# Patient Record
Sex: Male | Born: 1985 | Race: White | Hispanic: No | Marital: Single | Smoking: Former smoker
Health system: Southern US, Community
[De-identification: ages and names within clinical notes are randomized; demographics above are authoritative.]

---

## 2013-07-06 ENCOUNTER — Encounter (HOSPITAL_COMMUNITY): Payer: Self-pay | Admitting: *Deleted

## 2013-07-06 ENCOUNTER — Emergency Department (HOSPITAL_COMMUNITY)
Admission: EM | Admit: 2013-07-06 | Discharge: 2013-07-06 | Disposition: A | Discharge status: Home / Self Care | Payer: Medicaid Other | Attending: Emergency Medicine | Admitting: Emergency Medicine

## 2013-07-06 ENCOUNTER — Emergency Department (HOSPITAL_COMMUNITY): Payer: Medicaid Other

## 2013-07-06 DIAGNOSIS — S82839A Other fracture of upper and lower end of unspecified fibula, initial encounter for closed fracture: Secondary | ICD-10-CM

## 2013-07-06 DIAGNOSIS — Y9229 Other specified public building as the place of occurrence of the external cause: Secondary | ICD-10-CM | POA: Insufficient documentation

## 2013-07-06 DIAGNOSIS — S82899A Other fracture of unspecified lower leg, initial encounter for closed fracture: Secondary | ICD-10-CM | POA: Insufficient documentation

## 2013-07-06 DIAGNOSIS — Y9301 Activity, walking, marching and hiking: Secondary | ICD-10-CM | POA: Insufficient documentation

## 2013-07-06 DIAGNOSIS — R296 Repeated falls: Secondary | ICD-10-CM | POA: Insufficient documentation

## 2013-07-06 MED ORDER — OXYCODONE-ACETAMINOPHEN 5-325 MG PO TABS: 1.0000 | ORAL_TABLET | ORAL | Status: DC | PRN

## 2013-07-06 MED ORDER — IBUPROFEN 800 MG PO TABS
800.0000 mg | ORAL_TABLET | Freq: Once | ORAL | Status: AC
  Administered 2013-07-06: 800 mg via ORAL
  Filled 2013-07-06: qty 1

## 2013-07-06 MED ORDER — OXYCODONE-ACETAMINOPHEN 5-325 MG PO TABS
1.0000 | ORAL_TABLET | Freq: Once | ORAL | Status: AC
  Administered 2013-07-06: 1 via ORAL
  Filled 2013-07-06: qty 1

## 2013-07-06 MED ORDER — NAPROXEN 500 MG PO TABS: 500.0000 mg | ORAL_TABLET | Freq: Two times a day (BID) | ORAL | Status: DC
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
## 2013-07-06 NOTE — ED Provider Notes (Signed)
Medical screening examination/treatment/procedure(s) were performed by non-physician practitioner and as supervising physician I was immediately available for consultation/collaboration.    Rajni Holsworth D Danalee Flath, MD 07/06/13 2334 

## 2013-07-06 NOTE — ED Notes (Signed)
Pt broke his right leg last Saturday and has not been able to see an orthopedist. Pt states he is out of pain meds and he fell again and states his leg feels like it did when he broke it last weekend.

## 2013-07-13 ENCOUNTER — Emergency Department (HOSPITAL_COMMUNITY)
Admission: EM | Admit: 2013-07-13 | Discharge: 2013-07-13 | Disposition: A | Discharge status: Home / Self Care | Payer: Medicaid Other | Attending: Emergency Medicine | Admitting: Emergency Medicine

## 2013-07-13 ENCOUNTER — Emergency Department (HOSPITAL_COMMUNITY): Payer: Medicaid Other

## 2013-07-13 ENCOUNTER — Encounter (HOSPITAL_COMMUNITY): Payer: Self-pay | Admitting: *Deleted

## 2013-07-13 DIAGNOSIS — Y9389 Activity, other specified: Secondary | ICD-10-CM | POA: Insufficient documentation

## 2013-07-13 DIAGNOSIS — S82899A Other fracture of unspecified lower leg, initial encounter for closed fracture: Secondary | ICD-10-CM | POA: Insufficient documentation

## 2013-07-13 DIAGNOSIS — F17200 Nicotine dependence, unspecified, uncomplicated: Secondary | ICD-10-CM | POA: Insufficient documentation

## 2013-07-13 DIAGNOSIS — S82891A Other fracture of right lower leg, initial encounter for closed fracture: Secondary | ICD-10-CM

## 2013-07-13 DIAGNOSIS — W010XXA Fall on same level from slipping, tripping and stumbling without subsequent striking against object, initial encounter: Secondary | ICD-10-CM | POA: Insufficient documentation

## 2013-07-13 DIAGNOSIS — Y9289 Other specified places as the place of occurrence of the external cause: Secondary | ICD-10-CM | POA: Insufficient documentation

## 2013-07-13 MED ORDER — OXYCODONE-ACETAMINOPHEN 5-325 MG PO TABS
2.0000 | ORAL_TABLET | Freq: Once | ORAL | Status: AC
  Administered 2013-07-13: 2 via ORAL
  Filled 2013-07-13: qty 2

## 2013-07-13 MED ORDER — IBUPROFEN 800 MG PO TABS
800.0000 mg | ORAL_TABLET | Freq: Once | ORAL | Status: AC
  Administered 2013-07-13: 800 mg via ORAL
  Filled 2013-07-13: qty 1

## 2013-07-13 MED ORDER — OXYCODONE-ACETAMINOPHEN 5-325 MG PO TABS: 2.0000 | ORAL_TABLET | ORAL | Status: DC | PRN

## 2013-07-13 NOTE — ED Provider Notes (Signed)
CSN: [id number]     Arrival date & time 07/13/13  0124 History   First MD Initiated Contact with Patient 07/13/13 0135     No chief complaint on file.  (Consider location/radiation/quality/duration/timing/severity/associated sxs/prior Treatment) HPI History provided by patient. Right ankle injury tonight at home. Patient suffered ankle fracture about a week ago on the right side. He has since seen an orthopedic surgeon at [facility] and currently is in a cast.  Tonight, he jumped up to stop television set from falling and believes he injured something else in his ankle. He complains of swelling to his foot and pain at the lateral aspect. No other pain injury or trauma. He took pain medications prior to arrival and has minimal discomfort at this time. Otherwise has been using crutches as directed  History reviewed. No pertinent past medical history. History reviewed. No pertinent past surgical history. History reviewed. No pertinent family history. History  Substance Use Topics  . Smoking status: Current Some Day Smoker  . Smokeless tobacco: Not on file  . Alcohol Use: Yes     Comment: occasional    Review of Systems  Constitutional: Negative for fever and chills.  HENT: Negative for neck pain.   Respiratory: Negative for shortness of breath.   Cardiovascular: Negative for chest pain.  Gastrointestinal: Negative for abdominal pain.  Genitourinary: Negative for flank pain.  Musculoskeletal: Negative for back pain.  Skin: Negative for wound.  Neurological: Negative for headaches.  All other systems reviewed and are negative.    Allergies  Review of patient's allergies indicates no known allergies.  Home Medications   Current Outpatient Rx  Name  Route  Sig  Dispense  Refill  . naproxen (NAPROSYN) 500 MG tablet   Oral   Take 1 tablet (500 mg total) by mouth 2 (two) times daily.   30 tablet   0   . oxyCODONE-acetaminophen (PERCOCET/ROXICET) 5-325 MG per tablet   Oral   Take 1  tablet by mouth every 4 (four) hours as needed for pain.   20 tablet   0   . oxyCODONE-acetaminophen (PERCOCET/ROXICET) 5-325 MG per tablet   Oral   Take 1 tablet by mouth every 4 (four) hours as needed for pain.   6 tablet   0    BP 122/71  Pulse 66  Temp(Src) 97.5 F (36.4 C) (Oral)  Resp 20  Ht 5\' 11"  (1.803 m)  Wt 186 lb (84.369 kg)  BMI 25.95 kg/m2  SpO2 99% Physical Exam  Constitutional: He is oriented to person, place, and time. He appears well-developed and well-nourished.  HENT:  Head: Normocephalic and atraumatic.  Eyes: EOM are normal. Pupils are equal, round, and reactive to light.  Neck: Neck supple.  Cardiovascular: Normal rate, regular rhythm and intact distal pulses.   Pulmonary/Chest: Effort normal and breath sounds normal. No respiratory distress.  Musculoskeletal:  Right lower extremity with cast in place from foot to proximal tib-fib. Distal motor and sensorium intact with cap refill about 3 seconds and mild edema versus left lower extremity toes. No cyanosis.  Neurological: He is alert and oriented to person, place, and time.  Skin: Skin is warm and dry.    ED Course  Procedures (including critical care time) Labs Reviewed - No data to display  Cast removed. Patient has significant ankle swelling and tenderness over her medial and lateral malleolus with old ecchymosis. No obvious deformity otherwise.  Imaging Review Dg Ankle Complete Right  07/13/2013   *RADIOLOGY REPORT*  Clinical  Data: New lateral pain and ankle injury.  Cast removed tonight.  History of recent fracture.  RIGHT ANKLE - COMPLETE 3+ VIEW  Comparison: 07/06/2013  Findings: Oblique fracture of the distal right fibula is again demonstrated without significant change in alignment or position since the previous casting views.  Mild soft tissue swelling. There is a fragment demonstrated off of the posterior malleolus of the distal tibia which was not identified previously.  This could represent  an new acute fracture or it may just be better visualized today.  Prominent posterior process of the talus.  IMPRESSION: Stable alignment of the previous distal right fibular fracture. Osseous fragment posterior to the posterior malleolus was not seen previously and may represent a new fracture.   Original Report Authenticated By: Burman Nieves, M.D.   X-ray results shared with patient and placed in a posterior / sugar tong splint. He has crutches with him. He agrees to call his orthopedic surgeon on Monday to schedule followup in the office. Prescription provided. All questions answered. Discharge and followup instructions verbalized as understood.   MDM  Right ankle fracture New acute fracture with healing subacute fracture  Splint - distal neurovascular intact post procedure Previous records, vital signs and nurses notes reviewed     Sunnie Nielsen, MD 07/13/13 0403

## 2013-07-13 NOTE — ED Notes (Signed)
Pt reports that his leg was placed in a cast last week, tonight leg was injured and toes immediately began to swell. Reporting some numbness and tingling in toes.

## 2013-07-15 MED FILL — Oxycodone w/ Acetaminophen Tab 5-325 MG: ORAL | Qty: 6 | Status: AC

## 2014-02-28 ENCOUNTER — Encounter (HOSPITAL_COMMUNITY): Payer: Self-pay | Admitting: Emergency Medicine

## 2014-02-28 ENCOUNTER — Emergency Department (HOSPITAL_COMMUNITY)
Admission: EM | Admit: 2014-02-28 | Discharge: 2014-02-28 | Disposition: A | Discharge status: Home / Self Care | Payer: Medicaid Other | Attending: Emergency Medicine | Admitting: Emergency Medicine

## 2014-02-28 DIAGNOSIS — W230XXA Caught, crushed, jammed, or pinched between moving objects, initial encounter: Secondary | ICD-10-CM | POA: Insufficient documentation

## 2014-02-28 DIAGNOSIS — S3121XA Laceration without foreign body of penis, initial encounter: Secondary | ICD-10-CM

## 2014-02-28 DIAGNOSIS — Z23 Encounter for immunization: Secondary | ICD-10-CM | POA: Insufficient documentation

## 2014-02-28 DIAGNOSIS — Y929 Unspecified place or not applicable: Secondary | ICD-10-CM | POA: Insufficient documentation

## 2014-02-28 DIAGNOSIS — Z791 Long term (current) use of non-steroidal anti-inflammatories (NSAID): Secondary | ICD-10-CM | POA: Insufficient documentation

## 2014-02-28 DIAGNOSIS — S3120XA Unspecified open wound of penis, initial encounter: Secondary | ICD-10-CM | POA: Insufficient documentation

## 2014-02-28 DIAGNOSIS — Y9389 Activity, other specified: Secondary | ICD-10-CM | POA: Insufficient documentation

## 2014-02-28 DIAGNOSIS — F17200 Nicotine dependence, unspecified, uncomplicated: Secondary | ICD-10-CM | POA: Insufficient documentation

## 2014-02-28 MED ORDER — TETANUS-DIPHTH-ACELL PERTUSSIS 5-2.5-18.5 LF-MCG/0.5 IM SUSP
0.5000 mL | Freq: Once | INTRAMUSCULAR | Status: AC
  Administered 2014-02-28: 0.5 mL via INTRAMUSCULAR
  Filled 2014-02-28: qty 0.5

## 2014-02-28 NOTE — Discharge Instructions (Signed)
Tissue Adhesive Wound Care Some cuts, wounds, lacerations, and incisions can be repaired by using tissue adhesive. Tissue adhesive is like glue. It holds the skin together, allowing for faster healing. It forms a strong bond on the skin in about 1 minute and reaches its full strength in about 2 or 3 minutes. The adhesive disappears naturally while the wound is healing. It is important to take proper care of your wound at home while it heals.  HOME CARE INSTRUCTIONS   Showers are allowed. Do not soak the area containing the tissue adhesive. Do not take baths, swim, or use hot tubs. Do not use any soaps or ointments on the wound. Certain ointments can weaken the glue.  If a bandage (dressing) has been applied, follow your health care provider's instructions for how often to change the dressing.   Keep the dressing dry if one has been applied.   Do not scratch, pick, or rub the adhesive.   Do not place tape over the adhesive. The adhesive could come off when pulling the tape off.   Protect the wound from further injury until it is healed.   Protect the wound from sun and tanning bed exposure while it is healing and for several weeks after healing.   Only take over-the-counter or prescription medicines as directed by your health care provider.   Keep all follow-up appointments as directed by your health care provider. SEEK IMMEDIATE MEDICAL CARE IF:   Your wound becomes red, swollen, hot, or tender.   You develop a rash after the glue is applied.  You have increasing pain in the wound.   You have a red streak that goes away from the wound.   You have pus coming from the wound.   You have increased bleeding.  You have a fever.  You have shaking chills.   You notice a bad smell coming from the wound.   Your wound or adhesive breaks open.  MAKE SURE YOU:   Understand these instructions.  Will watch your condition.  Will get help right away if you are not doing  well or get worse. Document Released: 04/19/2001 Document Revised: 08/14/2013 Document Reviewed: 05/15/2013 [facility]ExitCare Patient Information 2014 [place], [place].  Tetanus, Diphtheria, Pertussis (Tdap) Vaccine What You Need to Know WHY GET VACCINATED? Tetanus, diphtheria and pertussis can be very serious diseases, even for adolescents and adults. Tdap vaccine can protect us from these diseases. TETANUS (Lockjaw) causes painful muscle tightening and stiffness, usually all over the body.  It can lead to tightening of muscles in the head and neck so you can't open your mouth, swallow, or sometimes even breathe. Tetanus kills about 1 out of 5 people who are infected. DIPHTHERIA can cause a thick coating to form in the back of the throat.  It can lead to breathing problems, paralysis, heart failure, and death. PERTUSSIS (Whooping Cough) causes severe coughing spells, which can cause difficulty breathing, vomiting and disturbed sleep.  It can also lead to weight loss, incontinence, and rib fractures. Up to 2 in 100 adolescents and 5 in 100 adults with pertussis are hospitalized or have complications, which could include pneumonia and death. These diseases are caused by bacteria. Diphtheria and pertussis are spread from person to person through coughing or sneezing. Tetanus enters the body through cuts, scratches, or wounds. Before vaccines, the [place] saw as many as 200,000 cases a year of diphtheria and pertussis, and hundreds of cases of tetanus. Since vaccination began, tetanus and diphtheria have dropped  by about 99% and pertussis by about 80%. TDAP VACCINE Tdap vaccine can protect adolescents and adults from tetanus, diphtheria, and pertussis. One dose of Tdap is routinely given at age 67 or 75. People who did not get Tdap at that age should get it as soon as possible. Tdap is especially important for health care professionals and anyone having close contact with a baby younger than 12  months. Pregnant women should get a dose of Tdap during every pregnancy, to protect the newborn from pertussis. Infants are most at risk for severe, life-threatening complications from pertussis. A similar vaccine, called Td, protects from tetanus and diphtheria, but not pertussis. A Td booster should be given every 10 years. Tdap may be given as one of these boosters if you have not already gotten a dose. Tdap may also be given after a severe cut or burn to prevent tetanus infection. Your doctor can give you more information. Tdap may safely be given at the same time as other vaccines. SOME PEOPLE SHOULD NOT GET THIS VACCINE  If you ever had a life-threatening allergic reaction after a dose of any tetanus, diphtheria, or pertussis containing vaccine, OR if you have a severe allergy to any part of this vaccine, you should not get Tdap. Tell your doctor if you have any severe allergies.  If you had a coma, or long or multiple seizures within 7 days after a childhood dose of DTP or DTaP, you should not get Tdap, unless a cause other than the vaccine was found. You can still get Td.  Talk to your doctor if you:  have epilepsy or another nervous system problem,  had severe pain or swelling after any vaccine containing diphtheria, tetanus or pertussis,  ever had Guillain-Barr Syndrome (GBS),  aren't feeling well on the day the shot is scheduled. RISKS OF A VACCINE REACTION With any medicine, including vaccines, there is a chance of side effects. These are usually mild and go away on their own, but serious reactions are also possible. Brief fainting spells can follow a vaccination, leading to injuries from falling. Sitting or lying down for about 15 minutes can help prevent these. Tell your doctor if you feel dizzy or light-headed, or have vision changes or ringing in the ears. Mild problems following Tdap (Did not interfere with activities)  Pain where the shot was given (about 3 in 4  adolescents or 2 in 3 adults)  Redness or swelling where the shot was given (about 1 person in 5)  Mild fever of at least 100.81F (up to about 1 in 25 adolescents or 1 in 100 adults)  Headache (about 3 or 4 people in 10)  Tiredness (about 1 person in 3 or 4)  Nausea, vomiting, diarrhea, stomach ache (up to 1 in 4 adolescents or 1 in 10 adults)  Chills, body aches, sore joints, rash, swollen glands (uncommon) Moderate problems following Tdap (Interfered with activities, but did not require medical attention)  Pain where the shot was given (about 1 in 5 adolescents or 1 in 100 adults)  Redness or swelling where the shot was given (up to about 1 in 16 adolescents or 1 in 25 adults)  Fever over 102F (about 1 in 100 adolescents or 1 in 250 adults)  Headache (about 3 in 20 adolescents or 1 in 10 adults)  Nausea, vomiting, diarrhea, stomach ache (up to 1 or 3 people in 100)  Swelling of the entire arm where the shot was given (up to about 3 in  100). Severe problems following Tdap (Unable to perform usual activities, required medical attention)  Swelling, severe pain, bleeding and redness in the arm where the shot was given (rare). A severe allergic reaction could occur after any vaccine (estimated less than 1 in a million doses). WHAT IF THERE IS A SERIOUS REACTION? What should I look for?  Look for anything that concerns you, such as signs of a severe allergic reaction, very high fever, or behavior changes. Signs of a severe allergic reaction can include hives, swelling of the face and throat, difficulty breathing, a fast heartbeat, dizziness, and weakness. These would start a few minutes to a few hours after the vaccination. What should I do?  If you think it is a severe allergic reaction or other emergency that can't wait, call 9-1-1 or get the person to the nearest hospital. Otherwise, call your doctor.  Afterward, the reaction should be reported to the "Vaccine Adverse Event  Reporting System" (VAERS). Your doctor might file this report, or you can do it yourself through the VAERS web site at www.vaers.LAgents.nohhs.gov, or by calling 1-206-255-0135. VAERS is only for reporting reactions. They do not give medical advice.  THE NATIONAL VACCINE INJURY COMPENSATION PROGRAM The National Vaccine Injury Compensation Program (VICP) is a federal program that was created to compensate people who may have been injured by certain vaccines. Persons who believe they may have been injured by a vaccine can learn about the program and about filing a claim by calling 1-548-579-2796 or visiting the VICP website at SpiritualWord.atwww.hrsa.gov/vaccinecompensation. HOW CAN I LEARN MORE?  Ask your doctor.  Call your local or state health department.  Contact the Centers for Disease Control and Prevention (CDC):  Call 450-164-68151-(973)294-0682 or visit CDC's website at PicCapture.uywww.cdc.gov/vaccines. CDC Tdap Vaccine VIS (03/15/12) Document Released: 04/24/2012 Document Revised: 02/18/2013 Document Reviewed: 02/13/2013 [facility]ExitCare Patient Information 2014 [place]ExitCare, [place]LLC.

## 2014-02-28 NOTE — ED Notes (Signed)
Discharge instructions given and reviewed with patient.  Patient verbalized understanding to follow up with Dr. Jerre SimonJavaid.  Patient ambulatory; discharged home in good condition.

## 2014-02-28 NOTE — ED Provider Notes (Signed)
CSN: [id number]     Arrival date & time 02/28/14  0134 History   First MD Initiated Contact with Patient 02/28/14 0137     Chief complaint: Penis injury  (Consider location/radiation/quality/duration/timing/severity/associated sxs/prior Treatment) The history is provided by the patient.   28 year old male had his penis get caught in his upper and states that there is a small laceration present. He does not know his last tetanus immunization was. He rates pain at 10/10. He denies other injury.  No past medical history on file. No past surgical history on file. No family history on file. History  Substance Use Topics  . Smoking status: Current Some Day Smoker  . Smokeless tobacco: Not on file  . Alcohol Use: Yes     Comment: occasional    Review of Systems  All other systems reviewed and are negative.     Allergies  Review of patient's allergies indicates no known allergies.  Home Medications   Prior to Admission medications   Medication Sig Start Date End Date Taking? Authorizing Provider  naproxen (NAPROSYN) 500 MG tablet Take 1 tablet (500 mg total) by mouth 2 (two) times daily. 07/06/13   Hope Orlene OchM Neese, NP  oxyCODONE-acetaminophen (PERCOCET/ROXICET) 5-325 MG per tablet Take 1 tablet by mouth every 4 (four) hours as needed for pain. 07/06/13   Hope Orlene OchM Neese, NP  oxyCODONE-acetaminophen (PERCOCET/ROXICET) 5-325 MG per tablet Take 1 tablet by mouth every 4 (four) hours as needed for pain. 07/06/13   Hope Orlene OchM Neese, NP  oxyCODONE-acetaminophen (PERCOCET/ROXICET) 5-325 MG per tablet Take 2 tablets by mouth every 4 (four) hours as needed for pain. 07/13/13   Sunnie NielsenBrian Opitz, MD   BP 131/78  Pulse 81  Temp(Src) 97.8 F (36.6 C) (Oral)  Resp 14  Ht 5\' 7"  (1.702 m)  Wt 185 lb (83.915 kg)  BMI 28.97 kg/m2  SpO2 98% Physical Exam  Nursing note and vitals reviewed.  28 year old male, resting comfortably and in no acute distress. Vital signs are normal. Oxygen saturation is 98%, which is  normal. Head is normocephalic and atraumatic. PERRLA, EOMI. Oropharynx is clear. Neck is nontender and supple without adenopathy or JVD. Back is nontender and there is no CVA tenderness. Lungs are clear without rales, wheezes, or rhonchi. Chest is nontender. Heart has regular rate and rhythm without murmur. Abdomen is soft, flat, nontender without masses or hepatosplenomegaly and peristalsis is normoactive. Genitalia: Uncircumcised penis. There is a superficial laceration on the ventral side of the glans penis and also on the prepuce. No deep laceration. Extremities have no cyanosis or edema, full range of motion is present. Skin is warm and dry without rash. Neurologic: Mental status is normal, cranial nerves are intact, there are no motor or sensory deficits.  ED Course  Procedures (including critical care time) LACERATION REPAIR Performed by: Dione Boozeavid Johnica Armwood Authorized by: Dione Boozeavid Marielys Trinidad Consent: Verbal consent obtained. Risks and benefits: risks, benefits and alternatives were discussed Consent given by: patient Patient identity confirmed: provided demographic data Prepped and Draped in normal sterile fashion Wound explored  Laceration Location: penis  Laceration Length: 0.3 cm  No Foreign Bodies seen or palpated  Anesthesia: none  Amount of cleaning: standard  Skin closure: close  Technique: Tissue Adhesive  Patient tolerance: Patient tolerated the procedure well with no immediate complications.  MDM   Final diagnoses:  Laceration of penis    Laceration of the penis. This will be treated with Dermabond. TDaP booster given.    Dione Boozeavid Garmon Dehn, MD 02/28/14  0213 

## 2014-02-28 NOTE — ED Notes (Signed)
Pt states he cut a piece of skin from the foreskin of his penis when he got it stuck in his zipper.

## 2015-09-29 ENCOUNTER — Emergency Department (HOSPITAL_COMMUNITY)
Admission: EM | Admit: 2015-09-29 | Discharge: 2015-09-29 | Disposition: A | Discharge status: Home / Self Care | Payer: Medicaid Other | Attending: Emergency Medicine | Admitting: Emergency Medicine

## 2015-09-29 ENCOUNTER — Encounter (HOSPITAL_COMMUNITY): Payer: Self-pay | Admitting: Emergency Medicine

## 2015-09-29 DIAGNOSIS — Z791 Long term (current) use of non-steroidal anti-inflammatories (NSAID): Secondary | ICD-10-CM | POA: Insufficient documentation

## 2015-09-29 DIAGNOSIS — L0211 Cutaneous abscess of neck: Secondary | ICD-10-CM | POA: Insufficient documentation

## 2015-09-29 DIAGNOSIS — F17200 Nicotine dependence, unspecified, uncomplicated: Secondary | ICD-10-CM | POA: Insufficient documentation

## 2015-09-29 DIAGNOSIS — R51 Headache: Secondary | ICD-10-CM | POA: Insufficient documentation

## 2015-09-29 DIAGNOSIS — L02811 Cutaneous abscess of head [any part, except face]: Secondary | ICD-10-CM

## 2015-09-29 MED ORDER — PROMETHAZINE HCL 12.5 MG PO TABS
12.5000 mg | ORAL_TABLET | Freq: Once | ORAL | Status: AC
  Administered 2015-09-29: 12.5 mg via ORAL
  Filled 2015-09-29: qty 1

## 2015-09-29 MED ORDER — DOXYCYCLINE HYCLATE 100 MG PO CAPS: 100.0000 mg | ORAL_CAPSULE | Freq: Two times a day (BID) | ORAL | Status: AC

## 2015-09-29 MED ORDER — ACETAMINOPHEN-CODEINE #3 300-30 MG PO TABS: 1.0000 | ORAL_TABLET | Freq: Four times a day (QID) | ORAL | Status: AC | PRN

## 2015-09-29 MED ORDER — CEPHALEXIN 500 MG PO CAPS
500.0000 mg | ORAL_CAPSULE | Freq: Once | ORAL | Status: AC
  Administered 2015-09-29: 500 mg via ORAL
  Filled 2015-09-29: qty 1

## 2015-09-29 MED ORDER — ACETAMINOPHEN-CODEINE #3 300-30 MG PO TABS
2.0000 | ORAL_TABLET | Freq: Once | ORAL | Status: AC
  Administered 2015-09-29: 2 via ORAL
  Filled 2015-09-29: qty 2

## 2015-09-29 MED ORDER — DOXYCYCLINE HYCLATE 100 MG PO TABS
100.0000 mg | ORAL_TABLET | Freq: Once | ORAL | Status: AC
  Administered 2015-09-29: 100 mg via ORAL
  Filled 2015-09-29: qty 1

## 2015-09-29 MED ORDER — MUPIROCIN CALCIUM 2 % NA OINT: TOPICAL_OINTMENT | NASAL | Status: AC

## 2015-09-29 NOTE — ED Notes (Signed)
Instructed pt to take all of antibiotics as prescribed.Pt verbalized understanding of no driving and to use caution within 4 hours of taking pain meds due to meds cause drowsiness 

## 2015-09-29 NOTE — ED Notes (Signed)
Pt reports boil to back of head for last several days. Pt reports intermittent chills.

## 2015-09-29 NOTE — Discharge Instructions (Signed)
Please soak the abscess area and warm Epsom salt water for 15-20 minutes daily until resolved. Please apply Bactroban ointment 2 times daily. Please use doxycycline 2 times daily with food. Use Tylenol or ibuprofen for mild pain, use Tylenol codeine for more severe pain. This medication may cause drowsiness, please use with caution. Abscess An abscess (boil or furuncle) is an infected area on or under the skin. This area is filled with yellowish-white fluid (pus) and other material (debris). HOME CARE   Only take medicines as told by your doctor.  If you were given antibiotic medicine, take it as directed. Finish the medicine even if you start to feel better.  If gauze is used, follow your doctor's directions for changing the gauze.  To avoid spreading the infection:  Keep your abscess covered with a bandage.  Wash your hands well.  Do not share personal care items, towels, or whirlpools with others.  Avoid skin contact with others.  Keep your skin and clothes clean around the abscess.  Keep all doctor visits as told. GET HELP RIGHT AWAY IF:   You have more pain, puffiness (swelling), or redness in the wound site.  You have more fluid or blood coming from the wound site.  You have muscle aches, chills, or you feel sick.  You have a fever. MAKE SURE YOU:   Understand these instructions.  Will watch your condition.  Will get help right away if you are not doing well or get worse.   This information is not intended to replace advice given to you by your health care provider. Make sure you discuss any questions you have with your health care provider.   Document Released: 04/11/2008 Document Revised: 04/24/2012 Document Reviewed: 01/07/2012 Elsevier Interactive Patient Education [employer]2016 Elsevier Inc.

## 2015-09-29 NOTE — ED Provider Notes (Signed)
CSN: [id number]     Arrival date & time 09/29/15  1624 History   First MD Initiated Contact with Patient 09/29/15 1631     Chief Complaint  Patient presents with  . Abscess     (Consider location/radiation/quality/duration/timing/severity/associated sxs/prior Treatment) Patient is a 29 y.o. male presenting with abscess. The history is provided by the patient.  Abscess Location:  Head/neck Head/neck abscess location:  Scalp Abscess quality: painful and redness   Red streaking: no   Duration:  1 week Progression:  Worsening Pain details:    Quality:  Aching and shooting   Severity:  Moderate   Duration:  1 week   Timing:  Intermittent   Progression:  Worsening Chronicity:  New Context: not diabetes and not immunosuppression   Relieved by:  Nothing Worsened by:  Draining/squeezing Associated symptoms: headaches   Associated symptoms: no fever and no nausea   Risk factors: no hx of MRSA     No past medical history on file. No past surgical history on file. No family history on file. Social History  Substance Use Topics  . Smoking status: Current Some Day Smoker -- 1.00 packs/day  . Smokeless tobacco: Not on file  . Alcohol Use: Yes     Comment: occasional    Review of Systems  Constitutional: Negative for fever.  Gastrointestinal: Negative for nausea.  Neurological: Positive for headaches.  All other systems reviewed and are negative.     Allergies  Review of patient's allergies indicates no known allergies.  Home Medications   Prior to Admission medications   Medication Sig Start Date End Date Taking? Authorizing Provider  naproxen (NAPROSYN) 500 MG tablet Take 1 tablet (500 mg total) by mouth 2 (two) times daily. 07/06/13   Hope Orlene OchM Neese, NP  oxyCODONE-acetaminophen (PERCOCET/ROXICET) 5-325 MG per tablet Take 1 tablet by mouth every 4 (four) hours as needed for pain. 07/06/13   Hope Orlene OchM Neese, NP  oxyCODONE-acetaminophen (PERCOCET/ROXICET) 5-325 MG per tablet  Take 1 tablet by mouth every 4 (four) hours as needed for pain. 07/06/13   Hope Orlene OchM Neese, NP  oxyCODONE-acetaminophen (PERCOCET/ROXICET) 5-325 MG per tablet Take 2 tablets by mouth every 4 (four) hours as needed for pain. 07/13/13   Sunnie NielsenBrian Opitz, MD   BP 116/66 mmHg  Pulse 83  Temp(Src) 97.7 F (36.5 C) (Oral)  Resp 18  Ht 5\' 10"  (1.778 m)  Wt 85.73 kg  BMI 27.12 kg/m2  SpO2 96% Physical Exam  Constitutional: He is oriented to person, place, and time. He appears well-developed and well-nourished.  Non-toxic appearance.  HENT:  Head: Normocephalic.    Right Ear: Tympanic membrane and external ear normal.  Left Ear: Tympanic membrane and external ear normal.  Eyes: EOM and lids are normal. Pupils are equal, round, and reactive to light.  Neck: Normal range of motion. Neck supple. Carotid bruit is not present.  No palpable nodes of the post auricular area, or the posterior neck.  Cardiovascular: Normal rate, regular rhythm, normal heart sounds, intact distal pulses and normal pulses.   Pulmonary/Chest: Breath sounds normal. No respiratory distress.  Abdominal: Soft. Bowel sounds are normal. There is no tenderness. There is no guarding.  Musculoskeletal: Normal range of motion.  Lymphadenopathy:       Head (right side): No submandibular adenopathy present.       Head (left side): No submandibular adenopathy present.    He has no cervical adenopathy.  Neurological: He is alert and oriented to person, place, and time. He  has normal strength. No cranial nerve deficit or sensory deficit.  Skin: Skin is warm and dry.  Psychiatric: He has a normal mood and affect. His speech is normal.  Nursing note and vitals reviewed.   ED Course  Procedures (including critical care time) Labs Review Labs Reviewed - No data to display  Imaging Review No results found. I have personally reviewed and evaluated these images and lab results as part of my medical decision-making.   EKG  Interpretation None      MDM  Vital signs well within normal limits. Patient has 3 raised scabbed areas of the right posterior scalp. There is also an abscess area that is firm to touch. No red streaks appreciated. This area shows signs of infection, but is not a candidate for incision and drainage at this time. The patient is asked to use warm Epsom salt soaks daily. He is to use Bactroban 2 times daily. He is also given a prescription for doxycycline 2 times daily with food. The patient is to use Tylenol or ibuprofen for mild pain. Prescription for Tylenol codeine given for more severe pain. The patient is to see his surgeon, or return to the emergency department if this area is not improving.    Final diagnoses:  None    *I have reviewed nursing notes, vital signs, and all appropriate lab and imaging results for this patient.Ivery Quale, PA-C 09/29/15 1658  Samuel Jester, DO 10/01/15 2100

## 2018-05-08 ENCOUNTER — Encounter (HOSPITAL_COMMUNITY): Payer: Self-pay | Admitting: Emergency Medicine

## 2018-05-08 ENCOUNTER — Emergency Department (HOSPITAL_COMMUNITY): Payer: BLUE CROSS/BLUE SHIELD

## 2018-05-08 ENCOUNTER — Other Ambulatory Visit: Payer: Self-pay

## 2018-05-08 ENCOUNTER — Emergency Department (HOSPITAL_COMMUNITY)
Admission: EM | Admit: 2018-05-08 | Discharge: 2018-05-09 | Disposition: A | Discharge status: Home / Self Care | Payer: BLUE CROSS/BLUE SHIELD | Attending: Emergency Medicine | Admitting: Emergency Medicine

## 2018-05-08 DIAGNOSIS — Y999 Unspecified external cause status: Secondary | ICD-10-CM | POA: Diagnosis not present

## 2018-05-08 DIAGNOSIS — S0101XA Laceration without foreign body of scalp, initial encounter: Secondary | ICD-10-CM

## 2018-05-08 DIAGNOSIS — Y9389 Activity, other specified: Secondary | ICD-10-CM | POA: Insufficient documentation

## 2018-05-08 DIAGNOSIS — Z87891 Personal history of nicotine dependence: Secondary | ICD-10-CM | POA: Diagnosis not present

## 2018-05-08 DIAGNOSIS — Y929 Unspecified place or not applicable: Secondary | ICD-10-CM | POA: Diagnosis not present

## 2018-05-08 DIAGNOSIS — S51011A Laceration without foreign body of right elbow, initial encounter: Secondary | ICD-10-CM | POA: Diagnosis not present

## 2018-05-08 DIAGNOSIS — S0990XA Unspecified injury of head, initial encounter: Secondary | ICD-10-CM | POA: Diagnosis present

## 2018-05-08 DIAGNOSIS — W1789XA Other fall from one level to another, initial encounter: Secondary | ICD-10-CM | POA: Insufficient documentation

## 2018-05-08 DIAGNOSIS — S0003XA Contusion of scalp, initial encounter: Secondary | ICD-10-CM

## 2018-05-08 MED ORDER — LIDOCAINE HCL (PF) 1 % IJ SOLN
20.0000 mL | Freq: Once | INTRAMUSCULAR | Status: AC
  Administered 2018-05-08: 20 mL

## 2018-05-08 MED ORDER — LIDOCAINE HCL (PF) 1 % IJ SOLN
INTRAMUSCULAR | Status: AC
  Administered 2018-05-08: 01:00:00 20 mL
  Filled 2018-05-08: qty 8

## 2018-05-08 NOTE — ED Triage Notes (Signed)
Pt states he fell 30 ft and has lac the back of his head and right elbow. Pt denies any loc.

## 2018-05-09 MED ORDER — HYDROCODONE-ACETAMINOPHEN 5-325 MG PO TABS: 2.0000 | ORAL_TABLET | ORAL | 0 refills | Status: AC | PRN

## 2018-05-09 NOTE — ED Notes (Signed)
Pt ambulatory to waiting room. Pt verbalized understanding of discharge instructions.   

## 2018-05-09 NOTE — Discharge Instructions (Signed)
Suture and staple removal in 8 days

## 2018-05-10 NOTE — ED Provider Notes (Signed)
[facility] EMERGENCY DEPARTMENT Provider Note   CSN: [id number] Arrival date & time: 05/08/18  2238     History   Chief Complaint Chief Complaint  Patient presents with  . Fall    HPI Timothy Kirk is a 32 y.o. male.  The history is provided by the patient. No language interpreter was used.  Fall  This is a new problem. The current episode started 1 to 2 hours ago. The problem occurs constantly. Associated symptoms include headaches. Nothing aggravates the symptoms. Nothing relieves the symptoms. He has tried nothing for the symptoms. The treatment provided no relief.  Pt [place] and rope [place] causing pt to fall head first onto rocks.  Pt has a cut to his elbow and his head.  Pt was able to control bleeding with duct tape.   History reviewed. No pertinent past medical history.  There are no active problems to display for this patient.   History reviewed. No pertinent surgical history.      Home Medications    Prior to Admission medications   Medication Sig Start Date End Date Taking? Authorizing Provider  acetaminophen-codeine (TYLENOL #3) 300-30 MG tablet Take 1-2 tablets by mouth every 6 (six) hours as needed. Take with food 09/29/15   Ivery Quale, PA-C  doxycycline (VIBRAMYCIN) 100 MG capsule Take 1 capsule (100 mg total) by mouth 2 (two) times daily. 09/29/15   Ivery Quale, PA-C  HYDROcodone-acetaminophen (NORCO/VICODIN) 5-325 MG tablet Take 2 tablets by mouth every 4 (four) hours as needed. 05/09/18   Elson Areas, PA-C  mupirocin nasal ointment (BACTROBAN) 2 % Apply to affected area of the scalp bid 09/29/15   Ivery Quale, PA-C    Family History No family history on file.  Social History Social History   Tobacco Use  . Smoking status: Former Smoker    Packs/day: 1.00  . Smokeless tobacco: Never Used  Substance Use Topics  . Alcohol use: Yes    Comment: occasional  . Drug use: No     Allergies   Patient has  no known allergies.   Review of Systems Review of Systems  Neurological: Positive for headaches.  All other systems reviewed and are negative.    Physical Exam Updated Vital Signs BP 138/81 (BP Location: Left Arm)   Pulse 92   Temp 98.4 F (36.9 C) (Oral)   Resp 19   Ht 5\' 7"  (1.702 m)   Wt 99.8 kg (220 lb)   SpO2 98%   BMI 34.46 kg/m   Physical Exam  Constitutional: He appears well-developed and well-nourished.  HENT:  Head: Atraumatic.  2 cm laceration occipital occipital scalp,   Eyes: Pupils are equal, round, and reactive to light.  Cardiovascular: Normal rate.  Pulmonary/Chest: Breath sounds normal.  Abdominal: Soft.  Musculoskeletal:  2cm laceration right elbow.   Neurological: He is alert.  Skin: Skin is warm.  Psychiatric: He has a normal mood and affect.  Nursing note and vitals reviewed.    ED Treatments / Results  Labs (all labs ordered are listed, but only abnormal results are displayed) Labs Reviewed - No data to display  EKG None  Radiology Dg Elbow Complete Right  Result Date: 05/09/2018 CLINICAL DATA:  Larey Seat onto right elbow EXAM: RIGHT ELBOW - COMPLETE 3+ VIEW COMPARISON:  None. FINDINGS: No acute fracture or malalignment. No significant elbow effusion. Posterior soft tissue swelling and laceration. No radiopaque foreign body IMPRESSION: No acute osseous abnormality  Electronically Signed   By: Jasmine PangKim  Fujinaga M.D.   On: 05/09/2018 00:27   Ct Head Wo Contrast  Result Date: 05/09/2018 CLINICAL DATA:  Fell 30 feet and hit back of head EXAM: CT HEAD WITHOUT CONTRAST TECHNIQUE: Contiguous axial images were obtained from the base of the skull through the vertex without intravenous contrast. COMPARISON:  None. FINDINGS: Brain: No evidence of acute infarction, hemorrhage, hydrocephalus, extra-axial collection or mass lesion/mass effect. Vascular: No hyperdense vessel or unexpected calcification. Skull: Normal. Negative for fracture or focal lesion.  Sinuses/Orbits: No acute finding. Other: Moderate hematoma at the posterior vertex. IMPRESSION: 1. No CT evidence for acute intracranial abnormality. 2. Moderate posterior scalp hematoma at the cranial vertex. Electronically Signed   By: Jasmine PangKim  Fujinaga M.D.   On: 05/09/2018 00:31    Procedures .Marland Kitchen.Laceration Repair Date/Time: 05/10/2018 5:56 PM Performed by: Elson AreasSofia, Rea Kalama K, PA-C Authorized by: Elson AreasSofia, Brennyn Haisley K, PA-C   Consent:    Consent obtained:  Verbal   Consent given by:  Patient   Alternatives discussed:  No treatment Anesthesia (see MAR for exact dosages):    Anesthesia method:  Local infiltration   Local anesthetic:  Lidocaine 1% w/o epi Laceration details:    Location:  Scalp   Scalp location:  Occipital   Length (cm):  2   Depth (mm):  2 Repair type:    Repair type:  Simple Pre-procedure details:    Preparation:  Patient was prepped and draped in usual sterile fashion Exploration:    Wound exploration: wound explored through full range of motion     Contaminated: no   Treatment:    Area cleansed with:  Betadine   Irrigation solution:  Sterile saline   Visualized foreign bodies/material removed: no   Skin repair:    Repair method:  Staples Approximation:    Approximation:  Loose Post-procedure details:    Patient tolerance of procedure:  Tolerated well, no immediate complications Comments:     2cm laceration right elbow  5 sutures 4.0 prolene to close    (including critical care time)  Medications Ordered in ED Medications  lidocaine (PF) (XYLOCAINE) 1 % injection 20 mL (20 mLs Infiltration Given by Other 05/08/18 0100)     Initial Impression / Assessment and Plan / ED Course  I have reviewed the triage vital signs and the nursing notes.  Pertinent labs & imaging results that were available during my care of the patient were reviewed by me and considered in my medical decision making (see chart for details).       Final Clinical Impressions(s) / ED Diagnoses    Final diagnoses:  Laceration of scalp, initial encounter  Laceration of right elbow, initial encounter  Contusion of scalp, initial encounter    ED Discharge Orders        Ordered    HYDROcodone-acetaminophen (NORCO/VICODIN) 5-325 MG tablet  Every 4 hours PRN     05/09/18 0109    An After Visit Summary was printed and given to the patient.   Elson AreasSofia, Emberly Tomasso K, [place] JerseyPA-C 05/10/18 1811    Maia PlanLong, Joshua G, MD 05/11/18 [phone]

## 2018-05-14 ENCOUNTER — Encounter (HOSPITAL_COMMUNITY): Payer: Self-pay | Admitting: Emergency Medicine

## 2018-05-14 ENCOUNTER — Emergency Department (HOSPITAL_COMMUNITY)
Admission: EM | Admit: 2018-05-14 | Discharge: 2018-05-14 | Disposition: A | Discharge status: Home / Self Care | Payer: BLUE CROSS/BLUE SHIELD | Attending: Emergency Medicine | Admitting: Emergency Medicine

## 2018-05-14 ENCOUNTER — Other Ambulatory Visit: Payer: Self-pay

## 2018-05-14 DIAGNOSIS — Z4802 Encounter for removal of sutures: Secondary | ICD-10-CM | POA: Diagnosis not present

## 2018-05-14 DIAGNOSIS — Z5189 Encounter for other specified aftercare: Secondary | ICD-10-CM

## 2018-05-14 NOTE — ED Provider Notes (Signed)
[facility] EMERGENCY DEPARTMENT Provider Note   CSN: [id number] Arrival date & time: 05/14/18  1634     History   Chief Complaint Chief Complaint  Patient presents with  . Suture / Staple Removal  . Wound Check    HPI Timothy Kirk is a 32 y.o. male.  Patient is a 32 year old male who presents to the emergency department for wound check of his posterior scalp and also his right elbow.  The patient sustained lacerations on July 2 after a fall.  He received staples to the scalp, and he had stitches noted of the elbow.  The patient states he has not had any problems with the staples, he developed an infection at the area of his elbow.  He was placed on antibiotics on July 5.  No fever or chills reported.  He has had some drainage from the laceration of the right elbow area, but no other problems reported.  He presents now for assistance with this issue.     History reviewed. No pertinent past medical history.  There are no active problems to display for this patient.   History reviewed. No pertinent surgical history.      Home Medications    Prior to Admission medications   Medication Sig Start Date End Date Taking? Authorizing Provider  acetaminophen-codeine (TYLENOL #3) 300-30 MG tablet Take 1-2 tablets by mouth every 6 (six) hours as needed. Take with food 09/29/15   Ivery Quale, PA-C  doxycycline (VIBRAMYCIN) 100 MG capsule Take 1 capsule (100 mg total) by mouth 2 (two) times daily. 09/29/15   Ivery Quale, PA-C  HYDROcodone-acetaminophen (NORCO/VICODIN) 5-325 MG tablet Take 2 tablets by mouth every 4 (four) hours as needed. 05/09/18   Elson Areas, PA-C  mupirocin nasal ointment (BACTROBAN) 2 % Apply to affected area of the scalp bid 09/29/15   Ivery Quale, PA-C    Family History History reviewed. No pertinent family history.  Social History Social History   Tobacco Use  . Smoking status: Former Smoker    Packs/day: 1.00  . Smokeless tobacco: Never Used   Substance Use Topics  . Alcohol use: Yes    Comment: occasional  . Drug use: No     Allergies   Patient has no known allergies.   Review of Systems Review of Systems  Constitutional: Negative for activity change.       All ROS Neg except as noted in HPI  HENT: Negative for nosebleeds.   Eyes: Negative for photophobia and discharge.  Respiratory: Negative for cough, shortness of breath and wheezing.   Cardiovascular: Negative for chest pain and palpitations.  Gastrointestinal: Negative for abdominal pain and blood in stool.  Genitourinary: Negative for dysuria, frequency and hematuria.  Musculoskeletal: Negative for arthralgias, back pain and neck pain.  Skin: Positive for wound.       Repaired laceration to the scalp and the right elbow  Neurological: Negative for dizziness, seizures and speech difficulty.  Psychiatric/Behavioral: Negative for confusion and hallucinations.     Physical Exam Updated Vital Signs BP 122/78 (BP Location: Left Arm)   Pulse 60   Temp 98.3 F (36.8 C) (Oral)   Resp 18   SpO2 97%   Physical Exam  Constitutional: He is oriented to person, place, and time. He appears well-developed and well-nourished.  Non-toxic appearance.  HENT:  Head: Normocephalic.  Right Ear: Tympanic membrane and external ear normal.  Left Ear: Tympanic membrane and external ear normal.  The wound to the scalp is  healing nicely.  The staples are in place.  There are no red streaks appreciated.  No hematoma noted.  Eyes: Pupils are equal, round, and reactive to light. EOM and lids are normal.  Neck: Normal range of motion. Neck supple. Carotid bruit is not present.  Cardiovascular: Normal rate, regular rhythm, normal heart sounds, intact distal pulses and normal pulses.  Pulmonary/Chest: Breath sounds normal. No respiratory distress.  Abdominal: Soft. Bowel sounds are normal. There is no tenderness. There is no guarding.  Musculoskeletal: Normal range of motion.  There  is full range of motion of the right elbow and shoulder.  There is a sutured laceration present at the elbow area.  There is minimal drainage present.  The drainage is mostly straw-colored to clear.  No purulent drainage noted at this time.  There is minimal redness at the site of the laceration, no red streaks appreciated.  There is full range of motion of the wrist and fingers.  Lymphadenopathy:       Head (right side): No submandibular adenopathy present.       Head (left side): No submandibular adenopathy present.    He has no cervical adenopathy.  Neurological: He is alert and oriented to person, place, and time. He has normal strength. No cranial nerve deficit or sensory deficit.  Skin: Skin is warm and dry.  Psychiatric: He has a normal mood and affect. His speech is normal.  Nursing note and vitals reviewed.    ED Treatments / Results  Labs (all labs ordered are listed, but only abnormal results are displayed) Labs Reviewed - No data to display  EKG None  Radiology No results found.  Procedures Procedures (including critical care time)  Medications Ordered in ED Medications - No data to display   Initial Impression / Assessment and Plan / ED Course  I have reviewed the triage vital signs and the nursing notes.  Pertinent labs & imaging results that were available during my care of the patient were reviewed by me and considered in my medical decision making (see chart for details).       Final Clinical Impressions(s) / ED Diagnoses MDM  The staples to the scalp were removed without problem.  The wound is healing nicely.  No evidence of infection.  The patient was started on antibiotics on July 5 for infection involving the wound to the elbow.  No red streaks appreciated at this time.  There is some mild drainage present, but mostly clear to straw-colored.  No hot joint appreciated.  No reported fever or chills.  The patient is asked to have this reassessed in the  next 3 to 4 days.  patient is to Return to the emergency department sooner if any signs of advancing infection, problems, or concerns.   Final diagnoses:  Removal of staples  Visit for wound check    ED Discharge Orders    None       Ivery QualeBryant, Oney Folz, PA-C 05/14/18 1904    Donnetta Hutchingook, Brian, MD 05/15/18 [phone]

## 2018-05-14 NOTE — ED Triage Notes (Signed)
PT states he has staples to back of head on 05/08/18 and sutures to back of right elbow on 05/08/18. PT started antibiotics on 05/11/18 for infection to right arm with sutures and some drainage still present.

## 2018-05-14 NOTE — Discharge Instructions (Addendum)
The staples to your scalp were removed.  Please see Ms. Leavy CellaBoyd, or return to the emergency department if any signs of advancing infection in this area.  You were started on antibiotics on July 5.  Please continue your antibiotics.  Please have the wound to your elbow rechecked on July 12 for possible suture removal and recheck of the wound.

## 2019-04-17 IMAGING — DX DG ELBOW COMPLETE 3+V*R*
4 series · 4 of 4 positions shown · non-contrast
Comparison: None.

CLINICAL DATA: Fell onto right elbow

EXAM:
RIGHT ELBOW - COMPLETE 3+ VIEW

[elbow ap]
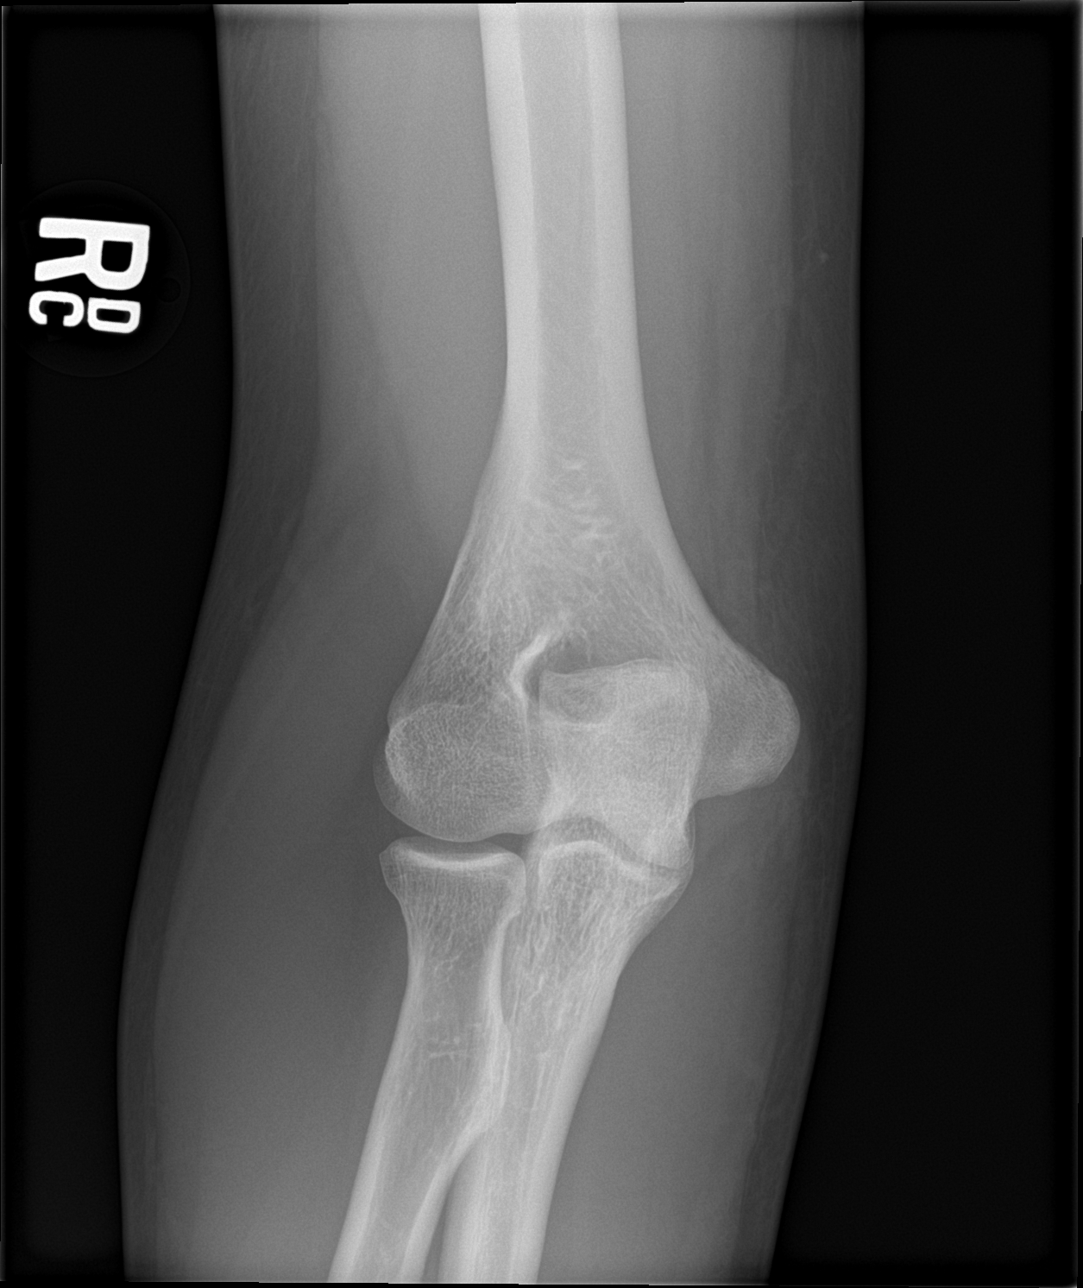

[elbow obl (1 of 3)]
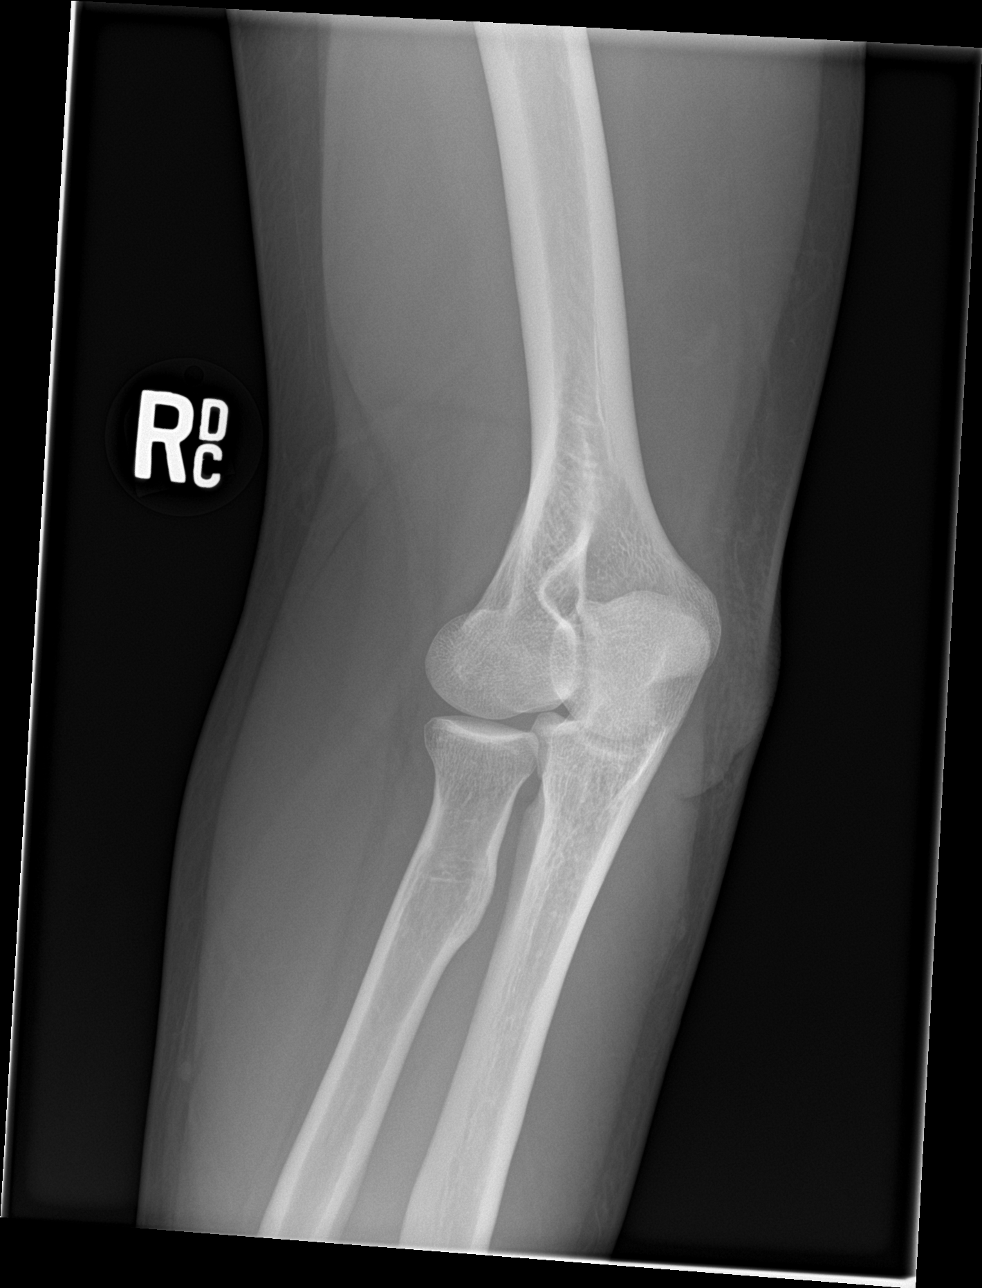

[elbow obl (2 of 3)]
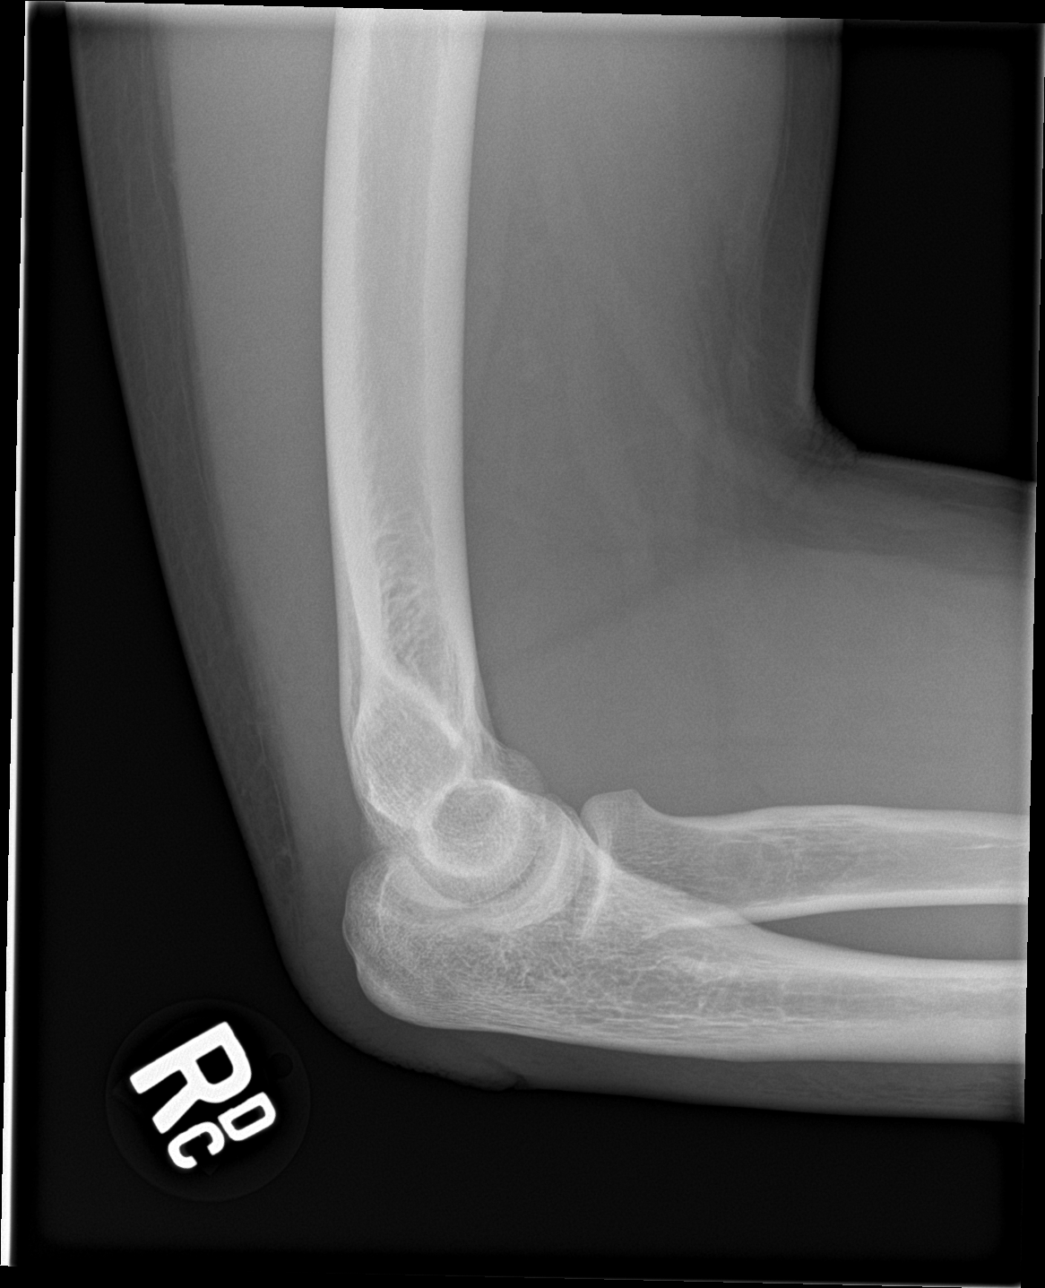

[elbow obl (3 of 3)]
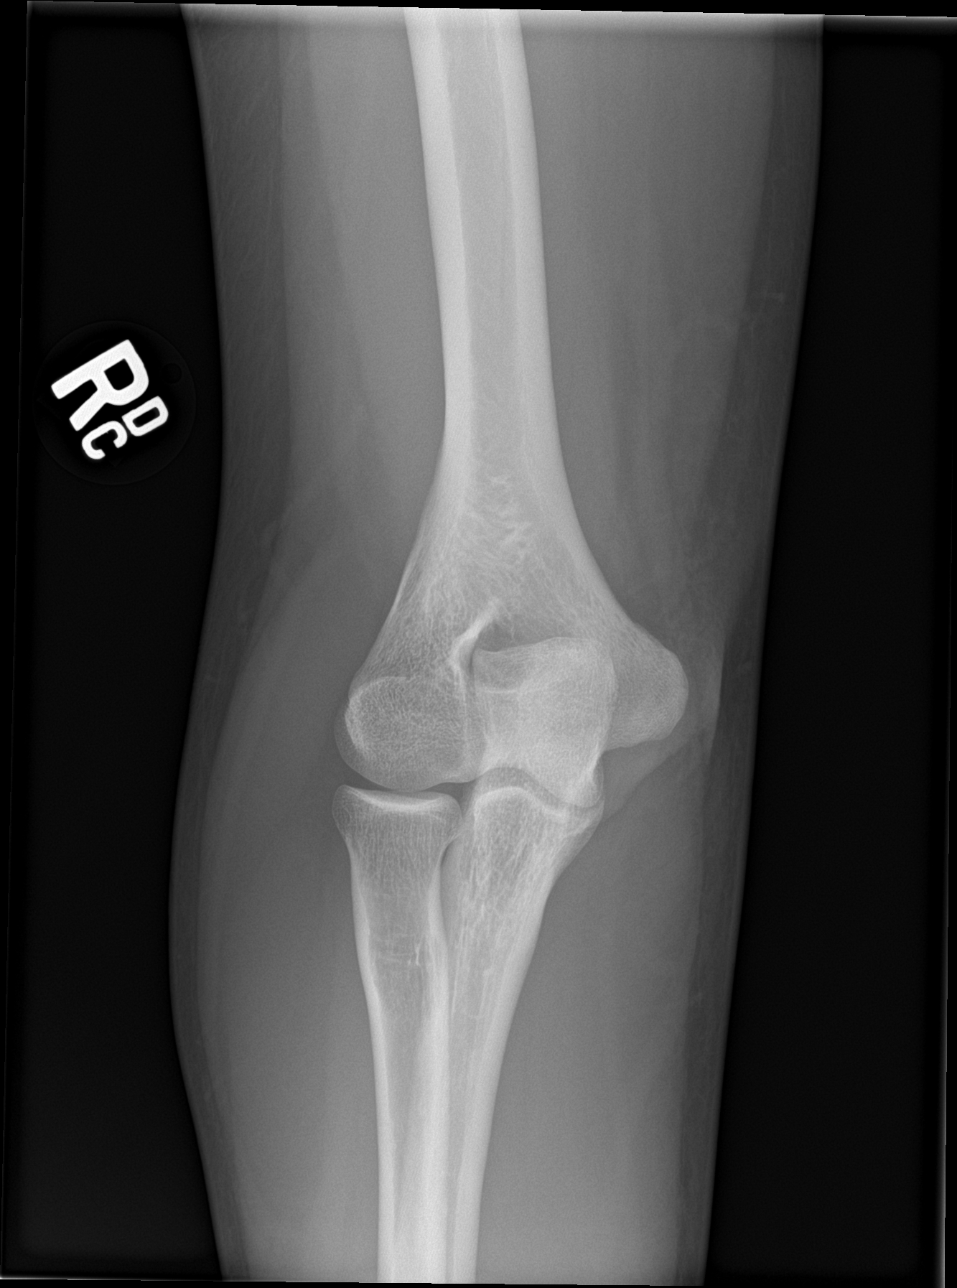

[4 of 4 positions shown; findings below may reference images not displayed]

FINDINGS: No acute fracture or malalignment. No significant elbow effusion.
Posterior soft tissue swelling and laceration. No radiopaque foreign
body
IMPRESSION: No acute osseous abnormality

## 2024-10-31 ENCOUNTER — Emergency Department (HOSPITAL_COMMUNITY): Admission: EM | Admit: 2024-10-31 | Discharge: 2024-10-31 | Disposition: A | Discharge status: Home / Self Care

## 2024-10-31 ENCOUNTER — Other Ambulatory Visit: Payer: Self-pay

## 2024-10-31 ENCOUNTER — Encounter (HOSPITAL_COMMUNITY): Payer: Self-pay

## 2024-10-31 DIAGNOSIS — T162XXA Foreign body in left ear, initial encounter: Secondary | ICD-10-CM | POA: Insufficient documentation

## 2024-10-31 DIAGNOSIS — X58XXXA Exposure to other specified factors, initial encounter: Secondary | ICD-10-CM | POA: Insufficient documentation

## 2024-10-31 MED ORDER — CIPROFLOXACIN-DEXAMETHASONE 0.3-0.1 % OT SUSP
4.0000 [drp] | Freq: Two times a day (BID) | OTIC | Status: DC
  Administered 2024-10-31: 4 [drp] via OTIC
  Filled 2024-10-31: qty 7.5

## 2024-10-31 MED ORDER — LIDOCAINE HCL (PF) 2 % IJ SOLN
10.0000 mL | Freq: Once | INTRAMUSCULAR | Status: AC
  Administered 2024-10-31: 10 mL

## 2024-10-31 MED ORDER — CIPROFLOXACIN-DEXAMETHASONE 0.3-0.1 % OT SUSP: 4.0000 [drp] | Freq: Two times a day (BID) | OTIC | 0 refills | Status: AC

## 2024-10-31 NOTE — ED Triage Notes (Signed)
 Pt states that he was awoken with the sensation of a bug in his L ear. Pt reports pain and feeling of movement. Small bug visualized with otoscope during triage.

## 2024-10-31 NOTE — ED Provider Notes (Signed)
 " [place] EMERGENCY DEPARTMENT AT [facility] Provider Note   CSN: [id number] Arrival date & time: 10/31/24  9187     Patient presents with: Foreign Body in Ear   Rohin Krejci is a 38 y.o. male.   38 year old male presenting to the emergency department today with foreign body in his left ear.  Patient states that he woke up this morning and felt some scratching and pain in his left ear.  He came to the emergency department at time.  Was initially having a lot of pain in the ear.   Foreign Body in Ear       Prior to Admission medications  Medication Sig Start Date End Date Taking? Authorizing Provider  ciprofloxacin -dexamethasone  (CIPRODEX ) OTIC suspension Place 4 drops into the left ear 2 (two) times daily. 10/31/24  Yes Ula Prentice SAUNDERS, MD  acetaminophen -codeine  (TYLENOL  #3) 300-30 MG tablet Take 1-2 tablets by mouth every 6 (six) hours as needed. Take with food 09/29/15   Armida Culver, PA-C  doxycycline  (VIBRAMYCIN ) 100 MG capsule Take 1 capsule (100 mg total) by mouth 2 (two) times daily. 09/29/15   Armida Culver, PA-C  HYDROcodone -acetaminophen  (NORCO/VICODIN) 5-325 MG tablet Take 2 tablets by mouth every 4 (four) hours as needed. 05/09/18   Sofia, Leslie K, PA-C  mupirocin  nasal ointment (BACTROBAN ) 2 % Apply to affected area of the scalp bid 09/29/15   Armida Culver, PA-C    Allergies: Patient has no known allergies.    Review of Systems  HENT:  Positive for ear pain.   All other systems reviewed and are negative.   Updated Vital Signs BP 118/73 (BP Location: Left Arm)   Pulse 63   Temp 97.7 F (36.5 C) (Oral)   Resp 18   SpO2 98%   Physical Exam Vitals and nursing note reviewed.   General: No acute distress HEENT: There is an insect noted in the left ear that is moving close to the patient's left TM  (all labs ordered are listed, but only abnormal results are displayed) Labs Reviewed - No data to display  EKG: None  Radiology: No results  found.   Procedures   Medications Ordered in the ED  ciprofloxacin -dexamethasone  (CIPRODEX ) 0.3-0.1 % OTIC (EAR) suspension 4 drop (has no administration in time range)  lidocaine  HCl (PF) (XYLOCAINE ) 2 % injection 10 mL (10 mLs Other Given 10/31/24 0845)                                    Medical Decision Making 38 year old male presenting to the emergency department today with insect in his left ear.  Patient was initially very uncomfortable.  Lidocaine  was placed in the ear and we did attempt to irrigate the ear.  Did attempt to remove the insect with suction here but was unsuccessful.  A call was placed to ENT to discuss.  The patient is feeling much better after irrigation of the ear and the insect does appear to be further away from the tympanic membrane at this time.  Will discuss at this would be feasible to have the patient follow-up tomorrow to have this removed in the office.  I discussed the patient's case with Dr. Anice.  He recommends starting the patient on Ciprodex  drops and he will see the patient in the office tomorrow.  Risk Prescription drug management.        Final diagnoses:  Foreign body of left  ear, initial encounter    ED Discharge Orders          Ordered    ciprofloxacin -dexamethasone  (CIPRODEX ) OTIC suspension  2 times daily        10/31/24 0929               Ula Prentice SAUNDERS, MD 10/31/24 [phone]  "

## 2024-10-31 NOTE — Discharge Instructions (Signed)
 I discussed your case with Dr. Anice from ENT.  He does not want you to start on the eardrops and to call their office first thing tomorrow morning.  He will see you in the office tomorrow to remove the insect.  Return to the ER for worsening symptoms.

## 2024-11-01 ENCOUNTER — Encounter (INDEPENDENT_AMBULATORY_CARE_PROVIDER_SITE_OTHER): Payer: Self-pay

## 2024-11-01 ENCOUNTER — Ambulatory Visit (INDEPENDENT_AMBULATORY_CARE_PROVIDER_SITE_OTHER)

## 2024-11-01 VITALS — BP 103/64 | HR 61 | Temp 98.0°F

## 2024-11-01 DIAGNOSIS — J342 Deviated nasal septum: Secondary | ICD-10-CM | POA: Diagnosis not present

## 2024-11-01 DIAGNOSIS — T162XXA Foreign body in left ear, initial encounter: Secondary | ICD-10-CM | POA: Diagnosis not present

## 2024-11-01 DIAGNOSIS — H9193 Unspecified hearing loss, bilateral: Secondary | ICD-10-CM

## 2024-11-01 DIAGNOSIS — T162XXD Foreign body in left ear, subsequent encounter: Secondary | ICD-10-CM

## 2024-11-01 NOTE — Progress Notes (Addendum)
 Addendum: correctly documented left ear  Dear Dr. Jolee, Here is my assessment for our mutual patient, Timothy Kirk. Thank you for allowing me the opportunity to care for your patient. Please do not hesitate to contact me should you have any other questions. Sincerely, Dr. Penne Croak  Otolaryngology Clinic Note Referring provider: Dr. Jolee HPI:  Discussed the use of AI scribe software for clinical note transcription with the patient, who gave verbal consent to proceed.  History of Present Illness Timothy Kirk is a 38 year old male who presents with a cockroach lodged in the left ear canal and associated ear canal trauma.  Foreign body in external auditory canal - Cockroach forcibly entered the left ear canal while he was sleeping on the floor at a friend's house on Christmas Eve. - Immediate onset of pain, panic, and discomfort upon entry of the insect. - Initial removal attempts at a local hospital included irrigation and manual extraction for 45 minutes to 1 hour, resulting in substantial pain and pressure. - Only partial removal of the cockroach was achieved; persistent sensation of the insect crawling deep within the canal.  External auditory canal trauma - Substantial pain and pressure during and after removal attempts. - Persistent discomfort and pressure in the left ear canal following the incident and interventions.  Hearing loss following ear trauma - Diminished hearing in the left ear following the incident and hospital interventions. - Partial recovery of hearing in the left ear began around midnight last night. - At the time of the visit, hearing in the left ear remains slightly desensitized but is not significantly impaired. - Hearing in the right ear is normal.  Otolaryngologic symptoms - No recent nasal or oral symptoms, including congestion, pain, or lesions. - No other complaints aside from some crooked teeth.   Independent Review of Additional Tests or Records:   Reviewed external note from referring PCP, Boyd,describing relevant history incorporated into todays evaluation.  PMH/Meds/All/SocHx/FamHx/ROS:  History reviewed. No pertinent past medical history.   History reviewed. No pertinent surgical history.  History reviewed. No pertinent family history.   Social Connections: Not on file     Current Medications[1]   Physical Exam:   BP 103/64 (BP Location: Left Arm, Patient Position: Sitting, Cuff Size: Normal)   Pulse 61   Temp 98 F (36.7 C)   SpO2 97%   The patient was awake, alert, and appropriate. The external ears were inspected, and otoscopy was performed to evaluate the external auditory canals and tympanic membranes. The nasal cavity and septum were examined for mucosal changes, obstruction, or discharge. The oral cavity and oropharynx were inspected for mucosal lesions, infection, or tonsillar hypertrophy. The neck was palpated for lymphadenopathy, thyroid abnormalities, or other masses. Cranial nerve function was grossly intact.  Pertinent Findings: General: Well developed, well nourished. No acute distress. Voice without hoarseness Head/Face: Normocephalic. No sinus tenderness. Facial nerve intact and equal bilaterally. No facial lacerations. Eyes: PERRL, no scleral icterus or conjunctival hemorrhage. EOMI. Ears: No gross deformity. Normal external canal. Tympanic membrane with normal landmarks bilaterally Hearing: Normal speech reception.  Nose: No gross deformity or lesions. No purulent discharge. No turbinate hypertrophy.  Mouth/Oropharynx: Lips without any lesions. Dentition . No mucosal lesions within the oropharynx. No tonsillar enlargement, exudate, or lesions. Pharyngeal walls symmetrical. Uvula midline. Tongue midline without lesions. Larynx: See TFL if applicable Nasopharynx: See TFL if applicable Neck: Trachea midline. No masses. No thyromegaly or nodules palpated. No crepitus. Lymphatic: No lymphadenopathy in the  neck. Respiratory:  No stridor or distress. Room air. Cardiovascular: Regular rate and rhythm. Extremities: No edema or cyanosis. Warm and well-perfused. Skin: No scars or lesions on face or neck. Neurologic: CN II-XII grossly intact. Moving all extremities without gross abnormality. Other:  Physical Exam HEENT: Atraumatic, normocephalic. Left ear canal with trauma. Left tympanic membrane with foreign body adhered. Hearing grossly intact. Nose normal. Oral cavity normal.   Seprately Identifiable Procedures:  I personally ordered, reviewed and interpreted the following with the patient today  Procedure: Bilateral ear microscopy and foreign body removal using microscope (CPT 69200) - Mod 25 Pre-procedure diagnosis: left Ear Foreign body Post-procedure diagnosis: same Indication: left Ear foreign body; given patient's otologic complaints and physical exam, bilateral otologic examination using microscope was performed and ear foreign body removed  Procedure: Patient was placed semi-recumbent. Both ear canals were examined using the microscope with findings above. Cerumen removed on left and on right using suction and currette with improvement in EAC examination and patency. Left: EAC was patent. TM was intact . Middle ear was aerated. Drainage:  Right: EAC was patent. TM was intact . Middle ear was aerated . Drainage:  Foreign body was removed. There was previous trauma to the [facility]. Intact TM - abrasion and ecchymosis to anterior and posterior EAC.  Patient tolerated the procedure well.    Impression & Plans:  Timothy Kirk is a 38 y.o. male  1. Foreign body of left ear, subsequent encounter   2. Bilateral hearing loss, unspecified hearing loss type   3. DNS (deviated nasal septum)    - Findings and diagnoses discussed in detail with the patient. - Risks, benefits, and alternatives were reviewed. Through shared decision making, the patient elects to proceed with below. Assessment &  Plan Foreign body in external ear canal Cockroach and wing fragments deeply embedded in left external auditory canal. Complete extraction achieved, including wing from tympanic membrane. No retained material post-procedure. - Removed foreign body and wing fragment from external auditory canal and tympanic membrane. - Inspected canal and tympanic membrane post-extraction to confirm complete removal.  Abrasion of external ear canal Abrasions and mild trauma due to prior removal attempts and today's extraction. - Advised to allow canal to heal before further instrumentation or procedures.  Hearing loss following ear trauma Transient hearing loss reported post-foreign body event and removal attempts. Gradual improvement with mild desensitization. Audiometric evaluation planned. - Scheduled audiometric evaluation after canal healing to assess for persistent hearing loss. - Arranged follow-up visit to re-examine ear and review audiometric results.  DNS  - monitor. Denies nasal congestion.   - Orders placed: No orders of the defined types were placed in this encounter.  - Medications prescribed/continued/adjusted: No orders of the defined types were placed in this encounter.  - Education materials provided to the patient. - Follow up: 2j weeks with audio. Patient instructed to return sooner or go to the ED if new/worsening symptoms develop.   Thank you for allowing me the opportunity to care for your patient. Please do not hesitate to contact me should you have any other questions.  Sincerely, Penne Croak, DO Otolaryngologist (ENT) [facility] Phone: [phone] Fax: [phone]  11/01/2024, 11:47 AM        [1]  Current Outpatient Medications:    acetaminophen -codeine  (TYLENOL  #3) 300-30 MG tablet, Take 1-2 tablets by mouth every 6 (six) hours as needed. Take with food, Disp: 20 tablet, Rfl: 0   ciprofloxacin -dexamethasone  (CIPRODEX ) OTIC suspension, Place 4  drops into the left ear  2 (two) times daily. (Patient not taking: Reported on 11/01/2024), Disp: 7.5 mL, Rfl: 0   doxycycline  (VIBRAMYCIN ) 100 MG capsule, Take 1 capsule (100 mg total) by mouth 2 (two) times daily. (Patient not taking: Reported on 11/01/2024), Disp: 14 capsule, Rfl: 0   HYDROcodone -acetaminophen  (NORCO/VICODIN) 5-325 MG tablet, Take 2 tablets by mouth every 4 (four) hours as needed. (Patient not taking: Reported on 11/01/2024), Disp: 6 tablet, Rfl: 0   mupirocin  nasal ointment (BACTROBAN ) 2 %, Apply to affected area of the scalp bid (Patient not taking: Reported on 11/01/2024), Disp: 1 g, Rfl: 1

## 2024-11-17 ENCOUNTER — Other Ambulatory Visit (INDEPENDENT_AMBULATORY_CARE_PROVIDER_SITE_OTHER): Payer: Self-pay

## 2024-11-17 DIAGNOSIS — H9193 Unspecified hearing loss, bilateral: Secondary | ICD-10-CM

## 2024-11-18 ENCOUNTER — Encounter (INDEPENDENT_AMBULATORY_CARE_PROVIDER_SITE_OTHER): Payer: Self-pay

## 2024-11-20 ENCOUNTER — Ambulatory Visit (INDEPENDENT_AMBULATORY_CARE_PROVIDER_SITE_OTHER)
